# Patient Record
Sex: Male | Born: 1945 | Race: White | Hispanic: No
Health system: Southern US, Community
[De-identification: ages and names within clinical notes are randomized; demographics above are authoritative.]

---

## 2009-01-19 ENCOUNTER — Ambulatory Visit (HOSPITAL_COMMUNITY): Admission: RE | Admit: 2009-01-19 | Discharge: 2009-01-19 | Payer: Self-pay | Admitting: Orthopedic Surgery

## 2009-02-20 ENCOUNTER — Ambulatory Visit (HOSPITAL_COMMUNITY): Admission: RE | Admit: 2009-02-20 | Discharge: 2009-02-21 | Payer: Self-pay | Admitting: Orthopedic Surgery

## 2011-04-04 LAB — COMPREHENSIVE METABOLIC PANEL
ALT: 46 U/L (ref 0–53)
Alkaline Phosphatase: 93 U/L (ref 39–117)
BUN: 9 mg/dL (ref 6–23)
CO2: 27 mEq/L (ref 19–32)
Calcium: 9.5 mg/dL (ref 8.4–10.5)
GFR calc non Af Amer: >60 mL/min (ref >60)
Glucose, Bld: 123 mg/dL — ABNORMAL HIGH (ref 70–99)
Sodium: 139 mEq/L (ref 135–145)

## 2011-04-04 LAB — CBC
HCT: 45.8 % (ref 39.0–52.0)
Hemoglobin: 15.8 g/dL (ref 13.0–17.0)
MCHC: 34.6 g/dL (ref 30.0–36.0)
RBC: 5.06 MIL/uL (ref 4.22–5.81)

## 2011-04-08 LAB — PROTIME-INR
INR: 0.9 (ref 0.00–1.49)
Prothrombin Time: 12.3 seconds (ref 11.6–15.2)

## 2011-04-08 LAB — CBC
RBC: 4.83 MIL/uL (ref 4.22–5.81)
WBC: 11.2 10*3/uL — ABNORMAL HIGH (ref 4.0–10.5)

## 2011-04-08 LAB — BASIC METABOLIC PANEL
Calcium: 9.4 mg/dL (ref 8.4–10.5)
Chloride: 104 mEq/L (ref 96–112)
Creatinine, Ser: 0.84 mg/dL (ref 0.4–1.5)
GFR calc Af Amer: >60 mL/min (ref >60)

## 2011-04-08 LAB — APTT: aPTT: 30 seconds (ref 24–37)

## 2011-05-07 NOTE — Op Note (Signed)
Jeffery Ortega, Jeffery Ortega                ACCOUNT NO.:  [id number]   MEDICAL RECORD NO.:  [id number]          PATIENT TYPE:  OIB   LOCATION:  5020                         FACILITY:  [facility]   PHYSICIAN:  Madelynn Done, MD  DATE OF BIRTH:  March 10, 1946   DATE OF PROCEDURE:  02/20/2009  DATE OF DISCHARGE:                               OPERATIVE REPORT   PREOPERATIVE DIAGNOSES:  Right acute, subacute carpometacarpal  dislocation with persistent instability.   POSTOPERATIVE DIAGNOSES:  Right acute, subacute carpometacarpal  dislocation with persistent instability.   ATTENDING SURGEON:  Sharma Covert IV, MD, who scrubbed and present for  the entire procedure.   ASSISTANT SURGEON:  None.   SURGICAL PROCEDURES:  1. Right thumb volar ligament reconstruction with tendon graft,      carpometacarpal joint with unstable thumb, and carpometacarpal      dislocation.  2. Radiograph, 3 views, right thumb.   IMPLANTS USED:  One 0.045 K-wire.   ANESTHESIA:  General via LMA.   SURGICAL INDICATIONS:  Mr. Heap is a 64 year old right-hand-dominant  gentleman who previously undergone surgery to his right thumb with an  CMC joint.  In the patient's postoperative period, the patient was noted  to have persistent subluxation and instability.  He was recommended to  undergo the above procedure.  The risks, benefits, and alternatives were  discussed in detail with the patient.  Signed informed consent was  obtained on the day of surgery.   DESCRIPTION OF PROCEDURE:  The patient was properly identified in the  preop holding area, was apparently marked in the right thumb to indicate  correct operative site.  The patient was then brought back to the  operating room, placed supine on the anesthesia room table where general  anesthesia was induced.  The patient tolerated this well.  Well-padded  tourniquet was then placed on the right brachium, sealed with a 1000  drape.  The right upper extremity was then  prepped with Betadine and  sterilely draped.  The patient had previous K-wire that was in the  thumb, metacarpal and an index metacarpal, and that was removed prior to  the prep.  A close manipulation was attempted and that was unsuccessful,  therefore an open reconstruction was then felt necessary.  The patient  received preoperative antibiotics prior to making a skin incision.   A Wagner incision was then made, direct approach to the thumb CMC joint.  Dissection was carried down through the skin and subcutaneous tissues.  The thenar musculature was then elevated to expose the [facility] joint.  The  FCR tendon was then identified and dissected proximally and distally all  the way to its insertion on the index metacarpal.  A transverse  arthrotomy was then made in the [facility] joint in order to expose the [facility]  joint.  Following this, the FCR again was identified both proximally and  distally.  The patient did have a grossly unstable joint, did have  several small chondral fragments within the joint, but there did not  appear to be any soft tissue, then there was slight soft  tissue  interposition dorsally.  Following this, the ligament reconstruction was  then begun.  Using the dorsal surface of the thumb, a dorsal and volar  drill hole was then placed.  It was enlarged with curette in order to  create and reconstruct the ligament channel in the thumb metacarpal.  Following this, half of the slip of the FCR was then harvested for  approximately 8 cm through one transverse incision in the forearm.  Following this, the tendon was then appropriately repaired.  Using a  tendon passer and through the drill hole, the tendon was then passed  through the thumb metacarpal.  The thumb was grossly unstable and  therefore the joint was reduced.  It was then held in place with a 0.045  K-wire from the trapezium in to the thumb metacarpal, which held the  joint nicely reduced.  Once the joint was then reduced, the  suture had  already been passed through the drill hole.  It was tensioned  appropriately and sewn to the periosteum.  This was done with 3-0  Ethibond suture.  Following this, it was passed deep to the APL and then  sewed deep to the APL to the dorsal capsule to reinforce the suture and  finally it was then sewn and reinforced back to the capsule.  It was  passed back around the free end of the FCR tendon and sutured back onto  the joint.  After creating the lasso, it was reinforced with several  more 3-0 Ethibond sutures.  Following this, the dorsal capsule was then  closed with several 3-0 Ethibond suture.  The wounds were then  thoroughly irrigated.  The thenar musculature interval was then  reattached with 3-0 Vicryl suture.  Following this, the tourniquet was  deflated, hemostasis was obtained with bipolar cautery, and there was  good perfusion of the hand.  Once this was completed, the pin was then  cut and then bent beneath the skin.  The skin was then closed over with  a 3-0 nylon simple and horizontal mattress sutures.  A 20 mL of 4%  Marcaine was infiltrated within the skin and subcutaneous tissues for  local anesthesia.  Final radiographs were obtained showing the reduction  of the thumb CMC joint.  Following this, the wounds were thoroughly  irrigated once again.  A Xeroform dressing, 4 x 4s, and a sterile  compressive dressings were then applied.  The patient was then placed in  a well-padded thumb spica splint.  Following this, the patient was  extubated and taken to recovery room in good condition.   POSTOPERATIVE PLAN:  The patient will be admitted overnight for IV  antibiotics and pain control.  He will be seen back in the office in  approximately 10 days for repeat wound check, suture removal, and x-  rays.  He will be in the thumb spica cast, total immobilization for 6  weeks with the tendon plate and then depending on whether or not the  patient can tolerate  removing  the pin in the office or as a small  outpatient procedure and back into a thumb spica cast for an additional  2 more weeks and then we will begin gradual activity between 8-10 week  mark.       Madelynn Done, MD  Electronically Signed     FWO/MEDQ  D:  02/20/2009  T:  02/21/2009  Job:  [id number]

## 2011-05-07 NOTE — Op Note (Signed)
NAME:  Jeffery Ortega, Jeffery Ortega                ACCOUNT NO.:  [id number]   MEDICAL RECORD NO.:  [id number]          PATIENT TYPE:  AMB   LOCATION:  SDS                          FACILITY:  [facility]   PHYSICIAN:  Madelynn Done, MD  DATE OF BIRTH:  11-16-1946   DATE OF PROCEDURE:  DATE OF DISCHARGE:                               OPERATIVE REPORT   PREOPERATIVE DIAGNOSIS:  Right thumb carpometacarpal joint dislocation.   POSTOPERATIVE DIAGNOSIS:  Right thumb carpometacarpal joint dislocation.   ATTENDING SURGEON:  Madelynn Done, MD, who scrubbed and present for  the entire procedure.   SURGICAL PROCEDURES:  1. Closed adduction percutaneous skeletal fixation of an unstable      carpometacarpal dislocation.  2. Radiograph, three views of right hand.   SURGICAL IMPLANTS:  Three 0.045 K-wires.   SURGICAL INDICATIONS:  [name] is a 65 year old gentleman who  sustained a closed injury to his right thumb.  The patient presented to  the office with an unstable and dislocated right thumb CMC joint.  The  patient was consented to proceed with the above procedure.  Risks,  benefits, and alternatives were discussed in detail with the patient and  signed informed consent was obtained on the day of surgery.   DESCRIPTION OF PROCEDURE:  The patient was properly identified in the  preoperative holding area and marked with a permanent marker made on the  right thumb to indicate the correct operative site.  The patient was  then brought back to the operating room, placed supine on the anesthesia  room table, where general anesthesia was administered via LMA.  The  patient tolerated this well.  A well-padded tourniquet was then placed  on the right thumb and sealed with a 1000 drape.  The right upper  extremity was then prepped with Hibiclens and sterilely draped.  A time-  out was called, the correct site was identified, and procedure was then  begun.  After adequate anesthesia, the closed manipulation  was then  performed.  This reduced the [facility] joint nicely.  Following this, the  three 0.045 K-wires were then placed across the [facility] joint, two from the  thumb metacarpal into the trapezium and one from the thumb metacarpal  into the index metacarpal.  There was good fixation of all three K-wires  and the position was then confirmed using the mini C-arm.  K-wires were  then cut and then bent and left out of the skin.  Final images were  obtained. The wound area was then thoroughly irrigated.  The Xeroform  dressing was applied around the pin sites.  Sterile compressive dressing  was around the pin sites.  A 10 mL of 0.25% Marcaine was infiltrated  locally for local on digital block.  The patient was then placed in well-  padded thumb-spica splint.  He was extubated and taken to recovery room  in good condition.   POSTOPERATIVE PLAN:  The patient will be discharged home, will be seen  back in the office in approximately 10-14 days for wound check, pin  check, and x-rays; total of 4  weeks with the pins.      Madelynn Done, MD  Electronically Signed     FWO/MEDQ  D:  01/19/2009  T:  01/20/2009  Job:  [id number]

## 2013-11-18 ENCOUNTER — Inpatient Hospital Stay (HOSPITAL_COMMUNITY): Payer: PRIVATE HEALTH INSURANCE

## 2013-11-18 ENCOUNTER — Inpatient Hospital Stay (HOSPITAL_COMMUNITY)
Admission: AD | Admit: 2013-11-18 | Discharge: 2013-12-23 | DRG: 871 | Disposition: E | Payer: PRIVATE HEALTH INSURANCE | Source: Other Acute Inpatient Hospital | Attending: Internal Medicine | Admitting: Internal Medicine

## 2013-11-18 DIAGNOSIS — Z87891 Personal history of nicotine dependence: Secondary | ICD-10-CM

## 2013-11-18 DIAGNOSIS — J9600 Acute respiratory failure, unspecified whether with hypoxia or hypercapnia: Secondary | ICD-10-CM

## 2013-11-18 DIAGNOSIS — I471 Supraventricular tachycardia, unspecified: Secondary | ICD-10-CM

## 2013-11-18 DIAGNOSIS — Z515 Encounter for palliative care: Secondary | ICD-10-CM

## 2013-11-18 DIAGNOSIS — A419 Sepsis, unspecified organism: Secondary | ICD-10-CM | POA: Diagnosis present

## 2013-11-18 DIAGNOSIS — G9340 Encephalopathy, unspecified: Secondary | ICD-10-CM | POA: Diagnosis present

## 2013-11-18 DIAGNOSIS — R634 Abnormal weight loss: Secondary | ICD-10-CM | POA: Diagnosis present

## 2013-11-18 DIAGNOSIS — R0600 Dyspnea, unspecified: Secondary | ICD-10-CM

## 2013-11-18 DIAGNOSIS — F411 Generalized anxiety disorder: Secondary | ICD-10-CM | POA: Diagnosis present

## 2013-11-18 DIAGNOSIS — E860 Dehydration: Secondary | ICD-10-CM | POA: Diagnosis present

## 2013-11-18 DIAGNOSIS — J189 Pneumonia, unspecified organism: Secondary | ICD-10-CM | POA: Diagnosis present

## 2013-11-18 DIAGNOSIS — I959 Hypotension, unspecified: Secondary | ICD-10-CM

## 2013-11-18 DIAGNOSIS — F0390 Unspecified dementia without behavioral disturbance: Secondary | ICD-10-CM

## 2013-11-18 DIAGNOSIS — R627 Adult failure to thrive: Secondary | ICD-10-CM | POA: Diagnosis present

## 2013-11-18 DIAGNOSIS — I498 Other specified cardiac arrhythmias: Secondary | ICD-10-CM | POA: Diagnosis present

## 2013-11-18 DIAGNOSIS — J438 Other emphysema: Secondary | ICD-10-CM | POA: Diagnosis present

## 2013-11-18 DIAGNOSIS — I10 Essential (primary) hypertension: Secondary | ICD-10-CM

## 2013-11-18 DIAGNOSIS — Z66 Do not resuscitate: Secondary | ICD-10-CM | POA: Diagnosis not present

## 2013-11-18 LAB — BLOOD GAS, ARTERIAL
Drawn by: 249101
Expiratory PAP: 8
RATE: 12 resp/min
TCO2: 27.2 mmol/L (ref 0–100)
pCO2 arterial: 35.8 mmHg (ref 35.0–45.0)
pH, Arterial: 7.476 — ABNORMAL HIGH (ref 7.350–7.450)

## 2013-11-18 LAB — COMPREHENSIVE METABOLIC PANEL
ALT: 8 U/L (ref 0–53)
Albumin: 3.2 g/dL — ABNORMAL LOW (ref 3.5–5.2)
Alkaline Phosphatase: 57 U/L (ref 39–117)
Potassium: 3.8 mEq/L (ref 3.5–5.1)
Sodium: 140 mEq/L (ref 135–145)
Total Protein: 7.2 g/dL (ref 6.0–8.3)

## 2013-11-18 LAB — CBC
HCT: 37.6 % — ABNORMAL LOW (ref 39.0–52.0)
Hemoglobin: 12.7 g/dL — ABNORMAL LOW (ref 13.0–17.0)
MCHC: 33.8 g/dL (ref 30.0–36.0)
RBC: 4.47 MIL/uL (ref 4.22–5.81)

## 2013-11-18 LAB — GLUCOSE, CAPILLARY: Glucose-Capillary: 122 mg/dL — ABNORMAL HIGH (ref 70–99)

## 2013-11-18 LAB — CREATININE, SERUM
Creatinine, Ser: 0.76 mg/dL (ref 0.50–1.35)
GFR calc non Af Amer: >90 mL/min (ref >90)

## 2013-11-18 MED ORDER — VANCOMYCIN HCL IN DEXTROSE 1-5 GM/200ML-% IV SOLN
1000.0000 mg | Freq: Two times a day (BID) | INTRAVENOUS | Status: DC
  Administered 2013-11-18 – 2013-11-20 (×4): 1000 mg via INTRAVENOUS
  Filled 2013-11-18 (×6): qty 200

## 2013-11-18 MED ORDER — LORAZEPAM 2 MG/ML IJ SOLN
0.5000 mg | INTRAMUSCULAR | Status: DC | PRN
  Administered 2013-11-18: 0.5 mg via INTRAVENOUS
  Administered 2013-11-19 (×2): 1 mg via INTRAVENOUS
  Administered 2013-11-20: 0.5 mg via INTRAVENOUS
  Administered 2013-11-21 – 2013-11-23 (×8): 1 mg via INTRAVENOUS
  Filled 2013-11-18 (×13): qty 1

## 2013-11-18 MED ORDER — ENOXAPARIN SODIUM 40 MG/0.4ML ~~LOC~~ SOLN
40.0000 mg | SUBCUTANEOUS | Status: DC
  Administered 2013-11-18 – 2013-11-22 (×5): 40 mg via SUBCUTANEOUS
  Filled 2013-11-18 (×6): qty 0.4

## 2013-11-18 MED ORDER — CEFEPIME HCL 1 G IJ SOLR
1.0000 g | Freq: Three times a day (TID) | INTRAMUSCULAR | Status: DC
  Administered 2013-11-18 – 2013-11-23 (×14): 1 g via INTRAVENOUS
  Filled 2013-11-18 (×16): qty 1

## 2013-11-18 MED ORDER — SODIUM CHLORIDE 0.9 % IV SOLN
INTRAVENOUS | Status: DC
  Administered 2013-11-18 – 2013-11-21 (×4): via INTRAVENOUS
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
## 2013-11-18 NOTE — Progress Notes (Signed)
ANTIBIOTIC CONSULT NOTE - INITIAL  Pharmacy Consult for Cefepime, Vancomycin Indication: pneumonia  Allergies not on file  Patient Measurements: Height: 5\' 10"  (177.8 cm) Weight: 147 lb 0.8 oz (66.7 kg) IBW/kg (Calculated) : 73  Labs: No results found for this basename: WBC, HGB, PLT, LABCREA, CREATININE,  in the last 72 hours Estimated Creatinine Clearance: 77.9 ml/min (by C-G formula based on Cr of 0.88). No results found for this basename: VANCOTROUGH, VANCOPEAK, VANCORANDOM, GENTTROUGH, GENTPEAK, GENTRANDOM, TOBRATROUGH, TOBRAPEAK, TOBRARND, AMIKACINPEAK, AMIKACINTROU, AMIKACIN,  in the last 72 hours   Assessment: 67 year old male with history of HTN and dementia who transferred from [facility] hospital for shortness of breath and pneumonia.  Beginning broad spectrum antibiotics.  Goal of Therapy:  Vancomycin trough level 15-20 mcg/ml Appropriate Cefepime dosing  Plan:  1) Cefepime 1 Gram iv Q 8 hours 2) Vancomycin 1 Gram iv Q 12 hours 3) Follow up Scr, fever trend, cultures  Thank you. Okey Regal, PharmD [phone]  Elwin Sleight 11/05/2013,7:03 PM

## 2013-11-19 ENCOUNTER — Encounter (HOSPITAL_COMMUNITY): Payer: Self-pay | Admitting: Family Medicine

## 2013-11-19 DIAGNOSIS — I517 Cardiomegaly: Secondary | ICD-10-CM

## 2013-11-19 DIAGNOSIS — I471 Supraventricular tachycardia: Secondary | ICD-10-CM

## 2013-11-19 DIAGNOSIS — I959 Hypotension, unspecified: Secondary | ICD-10-CM

## 2013-11-19 LAB — URINE MICROSCOPIC-ADD ON

## 2013-11-19 LAB — URINALYSIS, ROUTINE W REFLEX MICROSCOPIC
Glucose, UA: NEGATIVE mg/dL
Ketones, ur: NEGATIVE mg/dL
Protein, ur: 100 mg/dL — AB

## 2013-11-19 LAB — CBC
HCT: 35.6 % — ABNORMAL LOW (ref 39.0–52.0)
MCHC: 34 g/dL (ref 30.0–36.0)
MCV: 86.4 fL (ref 78.0–100.0)
RDW: 13.5 % (ref 11.5–15.5)
WBC: 12.2 10*3/uL — ABNORMAL HIGH (ref 4.0–10.5)

## 2013-11-19 LAB — T4, FREE: Free T4: 1.08 ng/dL (ref 0.80–1.80)

## 2013-11-19 LAB — BASIC METABOLIC PANEL
BUN: 24 mg/dL — ABNORMAL HIGH (ref 6–23)
Chloride: 104 mEq/L (ref 96–112)
Creatinine, Ser: 0.78 mg/dL (ref 0.50–1.35)
GFR calc Af Amer: >90 mL/min (ref >90)

## 2013-11-19 LAB — VITAMIN B12: Vitamin B-12: 422 pg/mL (ref 211–911)

## 2013-11-19 LAB — MAGNESIUM: Magnesium: 2.2 mg/dL (ref 1.5–2.5)

## 2013-11-19 LAB — LEGIONELLA ANTIGEN, URINE: Legionella Antigen, Urine: NEGATIVE

## 2013-11-19 LAB — TSH: TSH: 1.418 u[IU]/mL (ref 0.350–4.500)

## 2013-11-19 LAB — STREP PNEUMONIAE URINARY ANTIGEN: Strep Pneumo Urinary Antigen: NEGATIVE

## 2013-11-19 MED ORDER — LEVALBUTEROL HCL 0.63 MG/3ML IN NEBU
0.6300 mg | INHALATION_SOLUTION | Freq: Three times a day (TID) | RESPIRATORY_TRACT | Status: DC | PRN
  Administered 2013-11-19 – 2013-11-20 (×2): 0.63 mg via RESPIRATORY_TRACT
  Filled 2013-11-19 (×2): qty 3

## 2013-11-19 MED ORDER — METOPROLOL TARTRATE 1 MG/ML IV SOLN
2.5000 mg | Freq: Three times a day (TID) | INTRAVENOUS | Status: DC
  Administered 2013-11-19 – 2013-11-20 (×5): 2.5 mg via INTRAVENOUS
  Filled 2013-11-19 (×9): qty 5

## 2013-11-19 MED ORDER — IPRATROPIUM BROMIDE 0.02 % IN SOLN
0.5000 mg | Freq: Four times a day (QID) | RESPIRATORY_TRACT | Status: DC
  Administered 2013-11-19 – 2013-11-20 (×5): 0.5 mg via RESPIRATORY_TRACT
  Filled 2013-11-19 (×5): qty 2.5

## 2013-11-19 MED ORDER — SODIUM CHLORIDE 0.9 % IV BOLUS (SEPSIS)
250.0000 mL | Freq: Once | INTRAVENOUS | Status: AC
  Administered 2013-11-19: 250 mL via INTRAVENOUS

## 2013-11-19 NOTE — Progress Notes (Signed)
Utilization review completed.  

## 2013-11-19 NOTE — Progress Notes (Signed)
Patient having more frequent runs of SVT, non-sustained. Daphane Shepherd, NP made aware. Will continue to monitor. Elink aware as well.

## 2013-11-19 NOTE — Progress Notes (Signed)
Pt continues to have SVT-MD aware will continue to monitor and carry out new orders. Family at bedside.
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
## 2013-11-19 NOTE — Progress Notes (Signed)
Echo Lab  2D Echocardiogram completed.  Mayelin Panos L Vester Titsworth, RDCS 11/19/2013 2:14 PM   
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
## 2013-11-20 ENCOUNTER — Inpatient Hospital Stay (HOSPITAL_COMMUNITY): Payer: PRIVATE HEALTH INSURANCE

## 2013-11-20 DIAGNOSIS — J189 Pneumonia, unspecified organism: Secondary | ICD-10-CM

## 2013-11-20 DIAGNOSIS — J9600 Acute respiratory failure, unspecified whether with hypoxia or hypercapnia: Secondary | ICD-10-CM

## 2013-11-20 LAB — CBC
HCT: 34.8 % — ABNORMAL LOW (ref 39.0–52.0)
Hemoglobin: 11.7 g/dL — ABNORMAL LOW (ref 13.0–17.0)
MCH: 29.4 pg (ref 26.0–34.0)
MCHC: 33.6 g/dL (ref 30.0–36.0)
Platelets: 304 10*3/uL (ref 150–400)
RBC: 3.98 MIL/uL — ABNORMAL LOW (ref 4.22–5.81)
RDW: 13.3 % (ref 11.5–15.5)
WBC: 7.2 10*3/uL (ref 4.0–10.5)

## 2013-11-20 LAB — BLOOD GAS, ARTERIAL
Drawn by: 276051
FIO2: 0.7 %
Inspiratory PAP: 18
O2 Saturation: 96.3 %
RATE: 8 resp/min
pCO2 arterial: 38.1 mmHg (ref 35.0–45.0)
pH, Arterial: 7.422 (ref 7.350–7.450)
pO2, Arterial: 80 mmHg (ref 80.0–100.0)

## 2013-11-20 LAB — BASIC METABOLIC PANEL
BUN: 20 mg/dL (ref 6–23)
Creatinine, Ser: 0.59 mg/dL (ref 0.50–1.35)
GFR calc Af Amer: >90 mL/min (ref >90)
GFR calc non Af Amer: >90 mL/min (ref >90)
Glucose, Bld: 75 mg/dL (ref 70–99)
Potassium: 3.8 mEq/L (ref 3.5–5.1)

## 2013-11-20 LAB — VANCOMYCIN, TROUGH: Vancomycin Tr: 11.3 ug/mL (ref 10.0–20.0)

## 2013-11-20 MED ORDER — BIOTENE DRY MOUTH MT LIQD
15.0000 mL | Freq: Two times a day (BID) | OROMUCOSAL | Status: DC
  Administered 2013-11-20 (×2): 15 mL via OROMUCOSAL

## 2013-11-20 MED ORDER — METHYLPREDNISOLONE SODIUM SUCC 125 MG IJ SOLR
60.0000 mg | Freq: Four times a day (QID) | INTRAMUSCULAR | Status: DC
  Administered 2013-11-20: 60 mg via INTRAVENOUS
  Administered 2013-11-20: 23:00:00 via INTRAVENOUS
  Administered 2013-11-21: 0.6 mg via INTRAVENOUS
  Administered 2013-11-21: 60 mg via INTRAVENOUS
  Filled 2013-11-20: qty 2
  Filled 2013-11-20 (×5): qty 0.96
  Filled 2013-11-20: qty 2
  Filled 2013-11-20: qty 0.96
  Filled 2013-11-20: qty 2
  Filled 2013-11-20: qty 0.96

## 2013-11-20 MED ORDER — CHLORHEXIDINE GLUCONATE 0.12 % MT SOLN
15.0000 mL | Freq: Two times a day (BID) | OROMUCOSAL | Status: DC
  Administered 2013-11-20 – 2013-11-21 (×3): 15 mL via OROMUCOSAL
  Filled 2013-11-20 (×5): qty 15

## 2013-11-20 MED ORDER — VANCOMYCIN HCL 10 G IV SOLR
1250.0000 mg | Freq: Two times a day (BID) | INTRAVENOUS | Status: DC
  Administered 2013-11-20 – 2013-11-21 (×2): 1250 mg via INTRAVENOUS
  Filled 2013-11-20 (×3): qty 1250
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
## 2013-11-20 NOTE — Progress Notes (Signed)
SLP Cancellation Note  Patient Details Name: Jeffery Ortega MRN: [id number] DOB: 06/20/1946   Cancelled treatment:       Reason Eval/Treat Not Completed: Medical issues which prohibited therapy. Pt with decreased respiratory support requiring BiPAP at this time. Discussed with RN, who will page this clinician if pt is able to participate in dysphagia treatment later today.  Maxcine Ham, M.A. CCC-SLP [phone]    Maxcine Ham 11/20/2013, 9:44 AM

## 2013-11-20 NOTE — Progress Notes (Signed)
ANTIBIOTIC CONSULT NOTE - FOLLOW UP  Pharmacy Consult for Vancomycin, Cefepime Indication: pneumonia  No Known Allergies  Patient Measurements: Height: 5\' 10"  (177.8 cm) Weight: 146 lb 13.2 oz (66.6 kg) IBW/kg (Calculated) : 73  Vital Signs: Temp: 97.5 F (36.4 C) (11/29 1955) Temp src: Oral (11/29 1955) BP: 120/72 mmHg (11/29 1955) Pulse Rate: 82 (11/29 1955) Intake/Output from previous day: 11/28 0701 - 11/29 0700 In: 2593.8 [I.V.:2293.8; IV Piggyback:300] Out: 1025 [Urine:1025] Intake/Output from this shift: Total I/O In: 100 [I.V.:100] Out: -   Labs:  Recent Labs  Dec 09, 2013 1913 11/19/13 0415 11/20/13 0446  WBC 17.1* 12.2* 7.2  HGB 12.7* 12.1* 11.7*  PLT 333 337 304  CREATININE 0.77  0.76 0.78 0.59   Estimated Creatinine Clearance: 85.6 ml/min (by C-G formula based on Cr of 0.59).  Recent Labs  11/20/13 1845  VANCOTROUGH 11.3     Microbiology: Recent Results (from the past 720 hour(s))  MRSA PCR SCREENING     Status: None   Collection Time    Dec 09, 2013  6:50 PM      Result Value Range Status   MRSA by PCR NEGATIVE  NEGATIVE Final   Comment:            The GeneXpert MRSA Assay (FDA     approved for NASAL specimens     only), is one component of a     comprehensive MRSA colonization     surveillance program. It is not     intended to diagnose MRSA     infection nor to guide or     monitor treatment for     MRSA infections.  CULTURE, BLOOD (ROUTINE X 2)     Status: None   Collection Time    2013-12-09  7:05 PM      Result Value Range Status   Specimen Description BLOOD LEFT HAND   Final   Special Requests BOTTLES DRAWN AEROBIC ONLY 5CC   Final   Culture  Setup Time     Final   Value: 11/19/2013 01:31     Performed at [facility]   Culture     Final   Value:        BLOOD CULTURE RECEIVED NO GROWTH TO DATE CULTURE WILL BE HELD FOR 5 DAYS BEFORE ISSUING A FINAL NEGATIVE REPORT     Performed at [facility]   Report Status PENDING    Incomplete  CULTURE, BLOOD (ROUTINE X 2)     Status: None   Collection Time    Dec 09, 2013  7:15 PM      Result Value Range Status   Specimen Description BLOOD LEFT ARM   Final   Special Requests BOTTLES DRAWN AEROBIC ONLY 5CC   Final   Culture  Setup Time     Final   Value: 11/19/2013 01:32     Performed at [facility]   Culture     Final   Value:        BLOOD CULTURE RECEIVED NO GROWTH TO DATE CULTURE WILL BE HELD FOR 5 DAYS BEFORE ISSUING A FINAL NEGATIVE REPORT     Performed at [facility]   Report Status PENDING   Incomplete    Anti-infectives   Start     Dose/Rate Route Frequency Ordered Stop   2013-12-09 2000  ceFEPIme (MAXIPIME) 1 g in dextrose 5 % 50 mL IVPB     1 g 100 mL/hr over 30 Minutes Intravenous 3 times per day  10/24/2013 1848 11/26/13 2159   11/17/2013 2000  vancomycin (VANCOCIN) IVPB 1000 mg/200 mL premix     1,000 mg 200 mL/hr over 60 Minutes Intravenous Every 12 hours 11/13/2013 1907        Assessment: 67 year old male on Day #3 of Vancomycin and Cefepime for PNA.  His trough is slightly below the goal range.  Goal of Therapy:  Vancomycin trough level 15-20 mcg/ml  Plan:  Increase Vancomycin to 1250mg  IV q12h Continue Cefepime 1gm IV q8h Monitor renal function and clinical condition  Estella Husk, Pharm.D., BCPS, AAHIVP Clinical Pharmacist Phone: [phone] or [phone] 11/20/2013, 8:17 PM
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
## 2013-11-20 NOTE — Progress Notes (Signed)
Chaplain paged to provide pastoral care to the patient's family. Chaplain provided emotional and spiritual support to family. Spiritual support provided through prayer.   11/20/13 1715  Clinical Encounter Type  Visited With Family  Visit Type Initial;Spiritual support

## 2013-11-20 NOTE — Progress Notes (Signed)
Pt had 7 beat run VTach. Patient asleep. Daphane Shepherd, NP made aware. Will continue to monitor.

## 2013-11-20 NOTE — Progress Notes (Signed)
Conference with entire family.  After extensive discussion decision was made to make patient a full DNR and once starts to struggle to transition to comfort care.  Dr. Butler Denmark notified.  PCCM will sign off, please call back if needed.  DNR order filled.  To include no intubation, CPR, cardioversion, anti-arrhythmics or pressors.  Alyson Reedy, M.D. [facility] Pulmonary/Critical Care Medicine. Pager: [phone]. After hours pager: [phone].

## 2013-11-21 MED ORDER — METOPROLOL TARTRATE 1 MG/ML IV SOLN: 2.5000 mg | Freq: Four times a day (QID) | INTRAVENOUS | Status: DC | PRN

## 2013-11-21 MED ORDER — CHLORHEXIDINE GLUCONATE 0.12 % MT SOLN
15.0000 mL | Freq: Four times a day (QID) | OROMUCOSAL | Status: DC
  Administered 2013-11-21 – 2013-11-23 (×3): 15 mL via OROMUCOSAL
  Filled 2013-11-21 (×11): qty 15

## 2013-11-21 MED ORDER — BIOTENE DRY MOUTH MT LIQD
15.0000 mL | Freq: Four times a day (QID) | OROMUCOSAL | Status: DC
  Administered 2013-11-21 – 2013-11-23 (×6): 15 mL via OROMUCOSAL

## 2013-11-21 MED ORDER — METHYLPREDNISOLONE SODIUM SUCC 125 MG IJ SOLR
60.0000 mg | Freq: Two times a day (BID) | INTRAMUSCULAR | Status: DC
  Administered 2013-11-21 – 2013-11-22 (×3): 60 mg via INTRAVENOUS
  Filled 2013-11-21 (×5): qty 0.96

## 2013-11-21 MED ORDER — LEVALBUTEROL HCL 0.63 MG/3ML IN NEBU: 0.6300 mg | INHALATION_SOLUTION | RESPIRATORY_TRACT | Status: DC | PRN

## 2013-11-21 MED ORDER — LEVALBUTEROL HCL 0.63 MG/3ML IN NEBU
0.6300 mg | INHALATION_SOLUTION | Freq: Three times a day (TID) | RESPIRATORY_TRACT | Status: DC
  Administered 2013-11-21 – 2013-11-23 (×3): 0.63 mg via RESPIRATORY_TRACT
  Filled 2013-11-21 (×9): qty 3

## 2013-11-21 MED ORDER — IPRATROPIUM BROMIDE 0.02 % IN SOLN
0.5000 mg | Freq: Three times a day (TID) | RESPIRATORY_TRACT | Status: DC
  Administered 2013-11-21 – 2013-11-23 (×5): 0.5 mg via RESPIRATORY_TRACT
  Filled 2013-11-21 (×5): qty 2.5

## 2013-11-21 NOTE — Progress Notes (Signed)
RT called to room to assess pt respiratory status.  Upon arrival to room, patients Spo2 was 86%, RR 36, BP 110/64.  Patient is currently a DNR.  Patient is on BIPAP 18/8, FIO2 100%.  During nebulizer tx patient begins to moan.  Post tx Spo2 is 96%, RR 33, BBS diminished and clear.  Pt. Daughter in room to visit.  Steps out during tx, states she will be in the waiting area.
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
## 2013-11-21 NOTE — Progress Notes (Addendum)
Pt began to moan, decreased sats 85% on 6L, pt deep sx, pt placed Back on Bipap, Resp Therapy at [facility], sats 87%, pt calm

## 2013-11-22 LAB — GLUCOSE, CAPILLARY: Glucose-Capillary: 126 mg/dL — ABNORMAL HIGH (ref 70–99)
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
## 2013-11-22 NOTE — Progress Notes (Signed)
Utilization review completed.  

## 2013-11-22 NOTE — Progress Notes (Signed)
Palliative Medicine Team consult for goals of care; patient seen at bedside, some increased upper airway congestion and WOB noted RR=28-30, on 100% NRB mask, spoke with staff RN Alphonzo Lemmings who is aware and will address needs with current medications (Nebs, Ativan etc) in place and will also follow up with Dr Sharon Seller; spoke with family daughters Joan Flores, son Jonny Ruiz, patient's mother and 2 sisters at bedside - family offered time to meet with PMT provider later afternoon however stated family needed to leave to pick children up from school and request a time when all children could be present; patient's mother stated she is in ill health and would not be back for meeting nor would she have stayed for meeting later this afternoon - meeting time confirmed for tomorrow, Tuesday 12/14/2013 @ 10:00 am per family request Writer spoke with Dr Sharon Seller to inform of family request for meeting time above   Valente David, RN 11/22/2013, 3:26 PM Palliative Medicine Team RN Liaison [phone]

## 2013-11-22 NOTE — Progress Notes (Signed)
Per Dr Sharon Seller ok to place pt on NRB. Use Bipap PRN

## 2013-11-22 NOTE — Progress Notes (Signed)
Mask secure. No breakdown noted on face. Neb tx done. 

## 2013-11-22 NOTE — Progress Notes (Signed)
Mask secure. Small breakdwon noted on face. RN aware

## 2013-11-22 NOTE — Progress Notes (Addendum)
SLP Cancellation Note  Patient Details Name: Jeffery Ortega MRN: [id number] DOB: Mar 19, 1946   Cancelled treatment:        Reviewed chart for current status and updates.  Pt. Continues to be NPO, weaned to 4 liters O2 then required BIPAP.  RT note states he is now on a NRB mask.  Does not appear able to take po's and MD suspects he may require morphine in next 48 hours.  Will Will check status tomorrow and likely discharge.   [name].Ed [employer] [phone]  11/22/2013

## 2013-11-22 DEATH — deceased

## 2013-11-23 DIAGNOSIS — I10 Essential (primary) hypertension: Secondary | ICD-10-CM

## 2013-11-23 DIAGNOSIS — Z515 Encounter for palliative care: Secondary | ICD-10-CM

## 2013-11-23 DIAGNOSIS — R0609 Other forms of dyspnea: Secondary | ICD-10-CM

## 2013-11-23 DIAGNOSIS — R0600 Dyspnea, unspecified: Secondary | ICD-10-CM

## 2013-11-23 MED ORDER — WHITE PETROLATUM GEL
Status: AC
  Administered 2013-11-23: 0.2
  Filled 2013-11-23: qty 5

## 2013-11-23 MED ORDER — MORPHINE SULFATE 2 MG/ML IJ SOLN
2.0000 mg | Freq: Once | INTRAMUSCULAR | Status: AC
  Administered 2013-11-23: 2 mg via INTRAVENOUS

## 2013-11-23 MED ORDER — MORPHINE SULFATE 2 MG/ML IJ SOLN
2.0000 mg | INTRAMUSCULAR | Status: DC | PRN
  Administered 2013-11-23 – 2013-11-24 (×6): 2 mg via INTRAVENOUS
  Filled 2013-11-23 (×6): qty 1

## 2013-11-23 MED ORDER — LORAZEPAM 2 MG/ML IJ SOLN
1.0000 mg | INTRAMUSCULAR | Status: DC | PRN
  Administered 2013-11-23 (×2): 1 mg via INTRAVENOUS
  Filled 2013-11-23 (×2): qty 1

## 2013-11-23 MED ORDER — MORPHINE SULFATE 2 MG/ML IJ SOLN
INTRAMUSCULAR | Status: AC
  Filled 2013-11-23: qty 1

## 2013-11-23 MED ORDER — SCOPOLAMINE 1 MG/3DAYS TD PT72
1.0000 | MEDICATED_PATCH | TRANSDERMAL | Status: DC
  Administered 2013-11-23: 1.5 mg via TRANSDERMAL
  Filled 2013-11-23: qty 1

## 2013-11-23 NOTE — Progress Notes (Signed)
12/20/2013 0218  Oxygen Therapy  SpO2 ! 73 %  O2 Device Bi-PAP  FiO2 (%) 100 %  Pulse Oximetry Type Continuous    12/22/2013 0218  Oxygen Therapy  SpO2 ! 73 %  O2 Device Bi-PAP  FiO2 (%) 100 %  Pulse Oximetry Type Continuous  Pt desaturated on 100 NRB, placed pt on Bipap 18/8 at 100 %, pt becoming more unresponsive then previously, K Schorr from [facility] paged one  Order of morphine  For dyspnea given, went and got family in the waiting room called to pt's room, emotional support given will continue to monitor

## 2013-11-23 NOTE — Progress Notes (Signed)
Speech Language Pathology Discharge Patient Details Name: Jeffery Ortega MRN: [id number] DOB: Nov 07, 1946 Today's Date: 12-16-13 Time:  -     Patient discharged from SLP services secondary to medical decline - will need to re-order SLP to resume therapy services.  Please see latest therapy progress note for current level of functioning and progress toward goals.    Progress and discharge plan discussed with patient and/or caregiver: not discussed with family  GO    [name] M.Ed CCC-SLP Pager [phone]   Dec 16, 2013, 9:01 AM

## 2013-11-23 NOTE — Progress Notes (Signed)
Pt transferred to 6N19 per MD order. Report called to receiving nurse and all questions answered.
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
## 2013-11-25 LAB — CULTURE, BLOOD (ROUTINE X 2): Culture: NO GROWTH
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
## 2013-12-23 NOTE — Progress Notes (Signed)
2330 Patient's family at the bedside very anxious about patient's condition. Palliative care explain to family. Family agreed that they wanted comfort care only. 2349 Morphine 2mg  given IV. Family at the bedside. Will continue to monitor.
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
## 2013-12-23 DEATH — deceased
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
